# Patient Record
Sex: Male | Born: 1950 | Race: White | Hispanic: No | Marital: Single | Smoking: Never smoker
Health system: Southern US, Community
[De-identification: ages and names within clinical notes are randomized; demographics above are authoritative.]

## PROBLEM LIST (undated history)

## (undated) DIAGNOSIS — G809 Cerebral palsy, unspecified: Secondary | ICD-10-CM

## (undated) DIAGNOSIS — E785 Hyperlipidemia, unspecified: Secondary | ICD-10-CM

## (undated) DIAGNOSIS — F79 Unspecified intellectual disabilities: Secondary | ICD-10-CM

## (undated) DIAGNOSIS — E119 Type 2 diabetes mellitus without complications: Secondary | ICD-10-CM

---

## 2011-02-25 ENCOUNTER — Ambulatory Visit (HOSPITAL_COMMUNITY)
Admission: RE | Admit: 2011-02-25 | Discharge: 2011-02-25 | Disposition: A | Discharge status: Home / Self Care | Payer: Medicare Other | Source: Ambulatory Visit | Attending: Oral Surgery | Admitting: Oral Surgery

## 2011-02-25 ENCOUNTER — Other Ambulatory Visit (HOSPITAL_COMMUNITY): Payer: Self-pay | Admitting: Oral Surgery

## 2011-02-25 ENCOUNTER — Encounter (HOSPITAL_COMMUNITY)
Admission: RE | Admit: 2011-02-25 | Discharge: 2011-02-25 | Disposition: A | Discharge status: Home / Self Care | Payer: Medicare Other | Source: Ambulatory Visit | Attending: Oral Surgery | Admitting: Oral Surgery

## 2011-02-25 DIAGNOSIS — Z01811 Encounter for preprocedural respiratory examination: Secondary | ICD-10-CM | POA: Insufficient documentation

## 2011-02-25 DIAGNOSIS — K029 Dental caries, unspecified: Secondary | ICD-10-CM | POA: Insufficient documentation

## 2011-02-25 DIAGNOSIS — Z01812 Encounter for preprocedural laboratory examination: Secondary | ICD-10-CM | POA: Insufficient documentation

## 2011-02-25 DIAGNOSIS — Z01818 Encounter for other preprocedural examination: Secondary | ICD-10-CM | POA: Insufficient documentation

## 2011-02-25 LAB — BASIC METABOLIC PANEL
Chloride: 102 mEq/L (ref 96–112)
Creatinine, Ser: 0.54 mg/dL (ref 0.4–1.5)
GFR calc Af Amer: >60 mL/min (ref >60)
GFR calc non Af Amer: >60 mL/min (ref >60)
Potassium: 5 mEq/L (ref 3.5–5.1)

## 2011-02-25 LAB — CBC
Platelets: 297 10*3/uL (ref 150–400)
RBC: 4.49 MIL/uL (ref 4.22–5.81)
WBC: 8.2 10*3/uL (ref 4.0–10.5)

## 2011-03-03 ENCOUNTER — Ambulatory Visit (HOSPITAL_COMMUNITY)
Admission: RE | Admit: 2011-03-03 | Discharge: 2011-03-03 | Disposition: A | Discharge status: Home / Self Care | Payer: Medicare Other | Source: Ambulatory Visit | Attending: Oral Surgery | Admitting: Oral Surgery

## 2011-03-03 DIAGNOSIS — M278 Other specified diseases of jaws: Secondary | ICD-10-CM | POA: Insufficient documentation

## 2011-03-03 DIAGNOSIS — F79 Unspecified intellectual disabilities: Secondary | ICD-10-CM | POA: Insufficient documentation

## 2011-03-03 DIAGNOSIS — Z01818 Encounter for other preprocedural examination: Secondary | ICD-10-CM | POA: Insufficient documentation

## 2011-03-03 DIAGNOSIS — E119 Type 2 diabetes mellitus without complications: Secondary | ICD-10-CM | POA: Insufficient documentation

## 2011-03-03 DIAGNOSIS — Z01812 Encounter for preprocedural laboratory examination: Secondary | ICD-10-CM | POA: Insufficient documentation

## 2011-03-03 DIAGNOSIS — I10 Essential (primary) hypertension: Secondary | ICD-10-CM | POA: Insufficient documentation

## 2011-03-03 DIAGNOSIS — K089 Disorder of teeth and supporting structures, unspecified: Secondary | ICD-10-CM | POA: Insufficient documentation

## 2011-03-03 DIAGNOSIS — Z794 Long term (current) use of insulin: Secondary | ICD-10-CM | POA: Insufficient documentation

## 2011-03-03 DIAGNOSIS — E669 Obesity, unspecified: Secondary | ICD-10-CM | POA: Insufficient documentation

## 2011-03-03 DIAGNOSIS — Z01810 Encounter for preprocedural cardiovascular examination: Secondary | ICD-10-CM | POA: Insufficient documentation

## 2011-03-03 DIAGNOSIS — K029 Dental caries, unspecified: Secondary | ICD-10-CM | POA: Insufficient documentation

## 2011-03-03 LAB — GLUCOSE, CAPILLARY: Glucose-Capillary: 116 mg/dL — ABNORMAL HIGH (ref 70–99)

## 2011-03-12 NOTE — Op Note (Signed)
James Berger, James Berger                 ACCOUNT NO.:  [id number]  MEDICAL RECORD NO.:  [id number]           PATIENT TYPE:  O  LOCATION:  SDSC                         FACILITY:  [facility]  PHYSICIAN:  Georgia Lopes, M.D.  DATE OF BIRTH:  04-20-1951  DATE OF PROCEDURE:  03/03/2011 DATE OF DISCHARGE:  03/03/2011                              OPERATIVE REPORT   PREOPERATIVE DIAGNOSIS:  Non-restorable teeth # 4, 5,7, 8, 9, 10, 11, 20, 21, 22, 23, 24, 25, 26, and 27.  POSTOPERATIVE DIAGNOSES:  Non-restorable teeth # 4, 5,7, 8, 9, 10, 11, 20, 21, 22, 23, 24, 25, 26, and 27.  Lingual torus right mandible.  PROCEDURE:  Removal of teeth #4, 5, 7, 8, 9, 10, 11, 20, 21, 22, 23, 24, 25, 26, 27, alveoplasty of the right and left mandible, removal of right mandibular lingual tori.  SURGEON:  Georgia Lopes, MD  ANESTHESIA:  General, Dr. Gelene Mink.  ASSISTANTS:  Fraser Din and Cimler  INDICATIONS FOR PROCEDURE:  James Berger is a 60 year old male who was referredto my office by his general dentist for removal of all remaining teeth due to dental caries.  He has a history of mental retardation, he is an insulin-dependent diabetic with hypertension and is obese.  Because of the need for general anesthesia, the surgery was scheduled at [facility] for airway protection via intubation.  PROCEDURE:  The patient was taken to the operating room, placed on the table in supine position.  General anesthesia was administered intravenously and a nasal endotracheal tube was placed atraumatically. The tube was secured, the eyes protected, and the patient was draped for the procedure.  The posterior pharynx was suctioned.  A throat pack was placed, 2% lidocaine 1:100,000 epinephrine was infiltrated in inferior alveolar block on the right and left side and buccal and palatal infiltration of the maxilla, total of 18 mL was utilized.  Then, a #15 blade was used to make a full-thickness incision buccally and  lingually around teeth numbers 20, 21, 22, 23, 24, 25 and 26 in the mandible and around teeth numbers 7, 8, 9, 10, and 11 in the maxilla.  The periosteum was reflected buccally and lingually in the mandible, and buccally and palatally in the maxilla, and proximal bone was removed with the striker handpiece and in a 702 bur around the posterior teeth.  Then the teeth were elevated with 301 elevator and removed with the Asch forceps in the mandible and the upper #150 forceps in the maxilla, tooth #21 and 22 fractured additional bone was removed around these teeth with a drill and the teeth were removed with the 301 elevator and the rongeur.  The periosteum was further reflected and alveoplasty was performed with an egg-shaped bur and then a bone file.  The areas were then curetted and irrigated and closed with 3-0 chromic.  Bite block was repositioned. Attention was turned to right side of the mouth, #15 blade was used to make full-thickness incision overlying tooth #27 and carrying proximally to the area of the first molar.  In the maxilla, 15 blade was used to make a full-thickness  incision around teeth #4 and 5.  The periosteum was reflected buccally and palatally in the maxilla, and buccally and lingually in the mandible and then bone was removed with a 702 drill and the striker handpiece under irrigation.  The teeth were elevated with 301 elevator and removed from the mouth with the upper 150 forceps and the Asch forceps in the mandible.  The alveolar ridge was smoothed with an egg-shaped bur in the maxilla and mandible and then bone file was used to further smooth the area and the mandible.  On the right side, there was a lingual overhang of consistent with exostosis.  This was smoothed using the sylvan elevator for retraction of the lingual tissues and the egg-shaped bur.  Then areas were irrigated and closed with 3-0 chromic.  The oral cavity was inspected, found to have good contour  and closure.  The area was irrigated and suctioned.  Throat pack was removed.  The patient was awakened, taken to the recovery room breath spontaneously in good condition.  COMPLICATIONS:  None.  SPECIMENS:  None.     Georgia Lopes, M.D.     SMJ/MEDQ  D:  03/03/2011  T:  03/04/2011  Job:  [id number]  Electronically Signed by Ocie Doyne M.D. on 03/12/2011 10:06:09 AM

## 2013-06-27 ENCOUNTER — Other Ambulatory Visit: Payer: Self-pay | Admitting: Geriatric Medicine

## 2013-06-27 DIAGNOSIS — S065X9A Traumatic subdural hemorrhage with loss of consciousness of unspecified duration, initial encounter: Secondary | ICD-10-CM

## 2013-06-27 DIAGNOSIS — S065XAA Traumatic subdural hemorrhage with loss of consciousness status unknown, initial encounter: Secondary | ICD-10-CM

## 2013-06-29 ENCOUNTER — Ambulatory Visit
Admission: RE | Admit: 2013-06-29 | Discharge: 2013-06-29 | Disposition: A | Discharge status: Home / Self Care | Payer: Medicare Other | Source: Ambulatory Visit | Attending: Geriatric Medicine | Admitting: Geriatric Medicine

## 2013-06-29 DIAGNOSIS — S065X9A Traumatic subdural hemorrhage with loss of consciousness of unspecified duration, initial encounter: Secondary | ICD-10-CM

## 2013-08-10 ENCOUNTER — Emergency Department (HOSPITAL_COMMUNITY): Payer: Medicare Other

## 2013-08-10 ENCOUNTER — Encounter (HOSPITAL_COMMUNITY): Payer: Self-pay | Admitting: Emergency Medicine

## 2013-08-10 ENCOUNTER — Emergency Department (HOSPITAL_COMMUNITY)
Admission: EM | Admit: 2013-08-10 | Discharge: 2013-08-10 | Disposition: A | Discharge status: Home / Self Care | Payer: Medicare Other | Attending: Emergency Medicine | Admitting: Emergency Medicine

## 2013-08-10 DIAGNOSIS — S0181XA Laceration without foreign body of other part of head, initial encounter: Secondary | ICD-10-CM

## 2013-08-10 DIAGNOSIS — S0990XA Unspecified injury of head, initial encounter: Secondary | ICD-10-CM

## 2013-08-10 DIAGNOSIS — Z794 Long term (current) use of insulin: Secondary | ICD-10-CM | POA: Insufficient documentation

## 2013-08-10 DIAGNOSIS — S0180XA Unspecified open wound of other part of head, initial encounter: Secondary | ICD-10-CM | POA: Insufficient documentation

## 2013-08-10 DIAGNOSIS — Z8669 Personal history of other diseases of the nervous system and sense organs: Secondary | ICD-10-CM | POA: Insufficient documentation

## 2013-08-10 DIAGNOSIS — E119 Type 2 diabetes mellitus without complications: Secondary | ICD-10-CM | POA: Insufficient documentation

## 2013-08-10 DIAGNOSIS — Z79899 Other long term (current) drug therapy: Secondary | ICD-10-CM | POA: Insufficient documentation

## 2013-08-10 DIAGNOSIS — Y929 Unspecified place or not applicable: Secondary | ICD-10-CM | POA: Insufficient documentation

## 2013-08-10 DIAGNOSIS — Z8659 Personal history of other mental and behavioral disorders: Secondary | ICD-10-CM | POA: Insufficient documentation

## 2013-08-10 DIAGNOSIS — W1809XA Striking against other object with subsequent fall, initial encounter: Secondary | ICD-10-CM | POA: Insufficient documentation

## 2013-08-10 DIAGNOSIS — E785 Hyperlipidemia, unspecified: Secondary | ICD-10-CM | POA: Insufficient documentation

## 2013-08-10 DIAGNOSIS — Y9389 Activity, other specified: Secondary | ICD-10-CM | POA: Insufficient documentation

## 2013-08-10 HISTORY — DX: Type 2 diabetes mellitus without complications: E11.9

## 2013-08-10 HISTORY — DX: Unspecified intellectual disabilities: F79

## 2013-08-10 HISTORY — DX: Hyperlipidemia, unspecified: E78.5

## 2013-08-10 HISTORY — DX: Cerebral palsy, unspecified: G80.9

## 2013-08-10 MED ORDER — LIDOCAINE-EPINEPHRINE-TETRACAINE (LET) SOLUTION
3.0000 mL | Freq: Once | NASAL | Status: AC
  Administered 2013-08-10: 3 mL via TOPICAL
  Filled 2013-08-10: qty 3

## 2013-08-10 MED ORDER — ZIPRASIDONE MESYLATE 20 MG IM SOLR
INTRAMUSCULAR | Status: AC
  Filled 2013-08-10: qty 20

## 2013-08-10 MED ORDER — LORAZEPAM 2 MG/ML IJ SOLN
1.0000 mg | Freq: Once | INTRAMUSCULAR | Status: DC
  Filled 2013-08-10: qty 1

## 2013-08-10 MED ORDER — LORAZEPAM 1 MG PO TABS
1.0000 mg | ORAL_TABLET | Freq: Once | ORAL | Status: AC
  Administered 2013-08-10: 1 mg via ORAL
  Filled 2013-08-10: qty 1

## 2013-08-10 MED ORDER — ZIPRASIDONE MESYLATE 20 MG IM SOLR
15.0000 mg | Freq: Once | INTRAMUSCULAR | Status: AC
  Administered 2013-08-10: 15 mg via INTRAMUSCULAR

## 2013-08-10 MED ORDER — LORAZEPAM 2 MG/ML IJ SOLN
2.0000 mg | Freq: Once | INTRAMUSCULAR | Status: AC
  Administered 2013-08-10: 2 mg via INTRAVENOUS

## 2013-08-10 NOTE — ED Provider Notes (Signed)
CSN: [id number]     Arrival date & time 08/10/13  1900 History   First MD Initiated Contact with Patient 08/10/13 1909     Chief Complaint  Patient presents with  . Fall  . Head Injury   (Consider location/radiation/quality/duration/timing/severity/associated sxs/prior Treatment) HPI  61yM presenting after striking head during fall. Lost balance and struck forehead. Laceration to L eyebrow. Was wearing helmet at time of fall. Hx of multiple falls and SDH. Per caretaker at bedside, pt seems to be at his baseline. No blood thinners. No vomiting.    Past Medical History  Diagnosis Date  . Cerebral palsy   . Mental retardation   . Diabetes mellitus without complication   . Hyperlipemia    History reviewed. No pertinent past surgical history. History reviewed. No pertinent family history. History  Substance Use Topics  . Smoking status: Never Smoker   . Smokeless tobacco: Not on file  . Alcohol Use: No    Review of Systems  All systems reviewed and negative, other than as noted in HPI.   Allergies  Review of patient's allergies indicates no known allergies.  Home Medications   Current Outpatient Rx  Name  Route  Sig  Dispense  Refill  . acetaminophen (TYLENOL) 325 MG tablet   Oral   Take 650 mg by mouth at bedtime.         Marland Kitchen amLODipine (NORVASC) 10 MG tablet   Oral   Take 10 mg by mouth daily.         Marland Kitchen atorvastatin (LIPITOR) 10 MG tablet   Oral   Take 10 mg by mouth at bedtime.         . docusate sodium (COLACE) 100 MG capsule   Oral   Take 100 mg by mouth 2 (two) times daily.         . feeding supplement (GLUCERNA SHAKE) LIQD   Oral   Take 237 mLs by mouth 2 (two) times daily between meals.         . haloperidol (HALDOL) 2 MG tablet   Intramuscular   Inject 2 mg into the muscle every 8 (eight) hours as needed (extreme agitation).         . insulin aspart (NOVOLOG FLEXPEN) 100 UNIT/ML SOPN FlexPen   Subcutaneous   Inject 2-6 Units into the  skin 3 (three) times daily with meals. Sliding scale: 190-230=2 units, 231-270=3 units, 271-310=4 units, 311-350=5 units, 351-400=6 units         . insulin detemir (LEVEMIR) 100 UNIT/ML injection   Subcutaneous   Inject 36 Units into the skin daily.         . magnesium hydroxide (MILK OF MAGNESIA) 400 MG/5ML suspension   Oral   Take 30 mLs by mouth at bedtime.         Marland Kitchen PARoxetine (PAXIL) 30 MG tablet   Oral   Take 30 mg by mouth every morning.         Marland Kitchen QUEtiapine (SEROQUEL) 25 MG tablet   Oral   Take 25 mg by mouth 2 (two) times daily.         . tamsulosin (FLOMAX) 0.4 MG CAPS capsule   Oral   Take 0.4 mg by mouth.         . traZODone (DESYREL) 50 MG tablet   Oral   Take 50 mg by mouth at bedtime.         . valsartan (DIOVAN) 160 MG tablet   Oral  Take 320 mg by mouth daily.          BP 140/69  Pulse 77  Temp(Src) 98.2 F (36.8 C) (Oral)  Resp 18  SpO2 98% Physical Exam  Nursing note and vitals reviewed. Constitutional: He appears well-developed and well-nourished. No distress.  HENT:  Head: Normocephalic.  Stellate laceration L eyebrow, ~4cm in total length. Superficial abrasions to occipital region, chronic from helmet per caregiver  Eyes: Conjunctivae are normal. Right eye exhibits no discharge. Left eye exhibits no discharge.  Neck: Neck supple.  Cardiovascular: Normal rate, regular rhythm and normal heart sounds.  Exam reveals no gallop and no friction rub.   No murmur heard. Pulmonary/Chest: Effort normal and breath sounds normal. No respiratory distress.  Abdominal: Soft. He exhibits no distension. There is no tenderness.  Musculoskeletal: He exhibits no edema and no tenderness.  No midline spinal tenderness  Neurological: He is alert.  Skin: Skin is warm and dry.  Psychiatric:  Difficult to understand. Combative. Moving all extremities with good strength.     ED Course  Procedures (including critical care time)  LACERATION  REPAIR Performed by: Raeford Razor Authorized by: Raeford Razor Consent: Verbal consent obtained. Risks and benefits: risks, benefits and alternatives were discussed Consent given by: patient Patient identity confirmed: provided demographic data Prepped and Draped in normal sterile fashion Wound explored  Laceration Location: L eyebrow  Laceration Length: 3.5 cm  No Foreign Bodies seen or palpated  Anesthesia: local infiltration  Local anesthetic: lidocaine 2% w epinephrine  Anesthetic total: 4 ml  Irrigation method: syringe  Amount of cleaning: standard  Skin closure: layered, 5-0 vicryl rapide  Number of sutures: 9  Technique: simple interrupted and single deep suture to close dead space  Patient tolerance: Patient tolerated the procedure well with no immediate complications.  Labs Review Labs Reviewed - No data to display Imaging Review Ct Head Wo Contrast  08/10/2013   *RADIOLOGY REPORT*  Clinical Data:  Recent traumatic injury with pain  CT HEAD WITHOUT CONTRAST CT CERVICAL SPINE WITHOUT CONTRAST  Technique:  Multidetector CT imaging of the head and cervical spine was performed following the standard protocol without intravenous contrast.  Multiplanar CT image reconstructions of the cervical spine were also generated.  Comparison:  06/29/2013  CT HEAD  Findings: The bony calvarium is intact.  Soft tissue swelling is noted over the left orbit consistent with a recent injury. Atrophic changes are again identified. Prominent cisterna magna is noted.  The previously seen subdural hematoma is no longer identified.  No acute hemorrhage is seen.  No acute infarct is noted.  IMPRESSION: Chronic changes without acute intracranial abnormality. Soft tissue changes of the left orbit are seen consistent with a recent history.  CT CERVICAL SPINE  Findings: Seven cervical segments are well visualized.  Vertebral body height is well-maintained.  There is loss of the normal cervical  lordosis likely related to muscular spasm.  Multilevel osteophytic changes are seen with varying degrees of neural foraminal narrowing bilaterally.  No acute fracture or acute facet abnormality is seen.  The surrounding soft tissue structures show no acute abnormality.  A cystic lesion is noted in the right lobe of the thyroid.  IMPRESSION: Multilevel degenerative change without acute bony abnormality. Changes consistent with muscular spasm are noted.  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   Original Report Authenticated By: Alcide Clever, M.D.   Ct Cervical Spine Wo Contrast  08/10/2013   *RADIOLOGY REPORT*  Clinical Data:  Recent traumatic injury with  pain  CT HEAD WITHOUT CONTRAST CT CERVICAL SPINE WITHOUT CONTRAST  Technique:  Multidetector CT imaging of the head and cervical spine was performed following the standard protocol without intravenous contrast.  Multiplanar CT image reconstructions of the cervical spine were also generated.  Comparison:  06/29/2013  CT HEAD  Findings: The bony calvarium is intact.  Soft tissue swelling is noted over the left orbit consistent with a recent injury. Atrophic changes are again identified. Prominent cisterna magna is noted.  The previously seen subdural hematoma is no longer identified.  No acute hemorrhage is seen.  No acute infarct is noted.  IMPRESSION: Chronic changes without acute intracranial abnormality. Soft tissue changes of the left orbit are seen consistent with a recent history.  CT CERVICAL SPINE  Findings: Seven cervical segments are well visualized.  Vertebral body height is well-maintained.  There is loss of the normal cervical lordosis likely related to muscular spasm.  Multilevel osteophytic changes are seen with varying degrees of neural foraminal narrowing bilaterally.  No acute fracture or acute facet abnormality is seen.  The surrounding soft tissue structures show no acute abnormality.  A cystic lesion is noted in the right lobe of the  thyroid.  IMPRESSION: Multilevel degenerative change without acute bony abnormality. Changes consistent with muscular spasm are noted.  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   Original Report Authenticated By: Alcide Clever, M.D.    MDM   1. Facial laceration, initial encounter   2. Head injury, acute, initial encounter     9:45 PM Imaging as above. Attempted to suture but pt becoming very agitated, swinging arms and trying get up from bed. Too agitated to tolerate wound closure despite applying LET and giving an oral dose of ativan. For pt/my safety, will give additional meds.   Raeford Razor, MD 08/16/13 [phone]

## 2013-08-10 NOTE — ED Notes (Signed)
Per EMS: Pt from [facility].  Hx of MR/CP, diabetes.  States that there was an unwitnessed fall from a wheelchair.  Hit his head on the corner of a wall.  Lac noted to left eyebrow.  Bleeding controlled.  Pt normally wears a helmet and was wearing the helmet at the time of the fall.  Facility sent a tech with pt because pt will not allow strangers to care for him and becomes aggressive.

## 2014-01-10 ENCOUNTER — Telehealth: Payer: Self-pay | Admitting: *Deleted

## 2014-01-10 NOTE — Telephone Encounter (Signed)
Error - wrong pt

## 2014-04-11 IMAGING — CT CT HEAD W/O CM
3 of 4 series · 15 of 30 positions shown, 18 images · non-contrast
Comparison: 06/29/2013

CT HEAD

***ADDENDUM*** CREATED: 08/12/2013 [DATE]

I am now comparing the studies of [DATE], [DATE], [DATE], and
08/10/2013 to one another.
On 06/02/2013 the patient had a subdural hematoma along the
tentorium and along the posterior aspect of the interhemispheric
falx as well as over the left occipital lobe with a tiny amount of
subdural hemorrhage over the left parietal and frontal regions.
On 06/07/2013 the tiny areas of hemorrhage over the left frontal
and parietal regions had resolved.  The left occipital, falcine,
and tentorial subdural hemorrhages were not significantly changed.
On 06/29/2013, after the patient fell again, there is no new
hemorrhage.  The scan demonstrates aging of the left occipital
subdural hematoma and decreased blood along the tentorium and
interhemispheric falx.
On  08/10/2013 in the left occipital subdural hematoma is either
isointense or has resolved.  The tentorium and falcine components
of the hemorrhage are further diminished.  No new hemorrhages.
CLINICAL DATA: Recent traumatic injury with pain
CT HEAD WITHOUT CONTRAST
CT CERVICAL SPINE WITHOUT CONTRAST
TECHNIQUE: Multidetector CT imaging of the head and cervical spine
was performed following the standard protocol without intravenous
contrast.  Multiplanar CT image reconstructions of the cervical
spine were also generated.

[Series 3: bone windows · axial · 0.48mm/px · z∈[+1165,+1237]mm · 3 of 49 slices shown]
[im 13/49  bone]
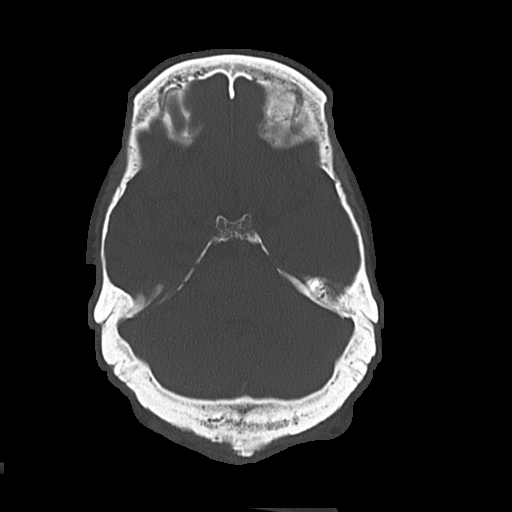
[im 25/49  bone]
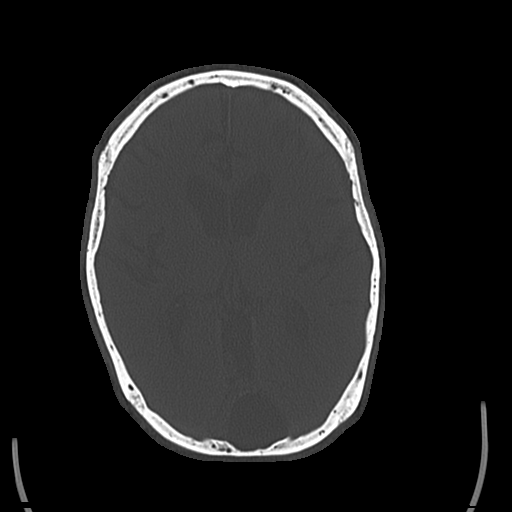
[im 37/49  bone]
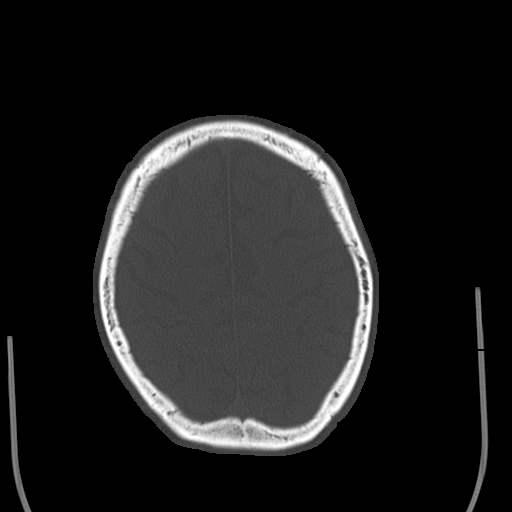

[Series 4: c-spine st · axial · 0.34mm/px · z∈[+960,+998]mm · 3 of 97 slices shown]
[im 10/97  brain]
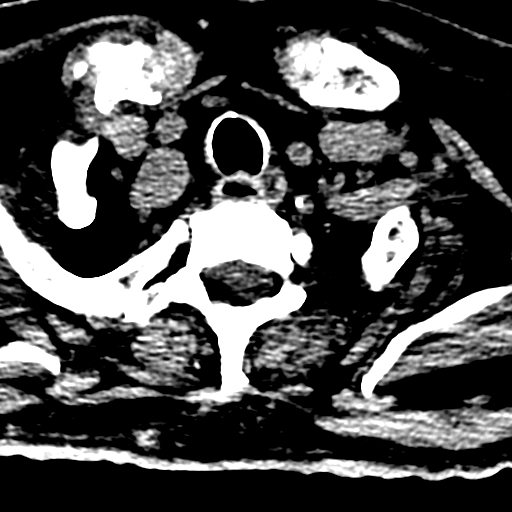
[im 20/97  brain]
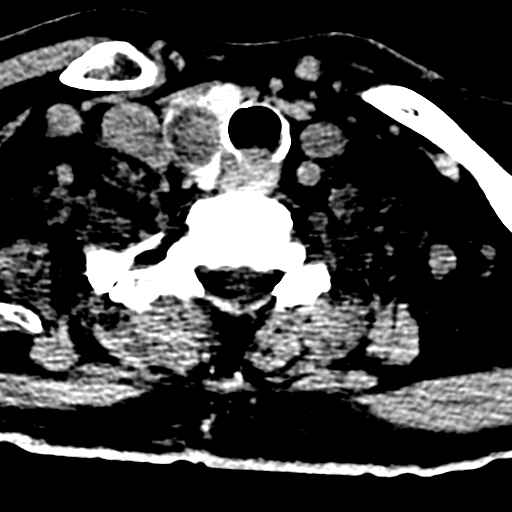
[im 29/97  brain]
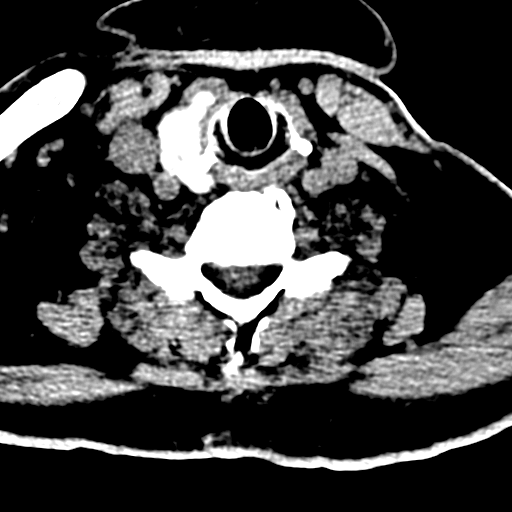

[Series 9: axial recon · axial · 0.23mm/px · z∈[+952,+1103]mm · 9 of 96 slices shown, 12 images]
[im 10/96  brain]
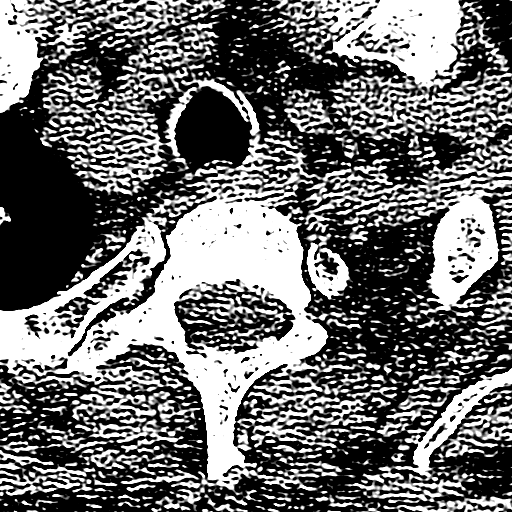
[im 10/96  bone]
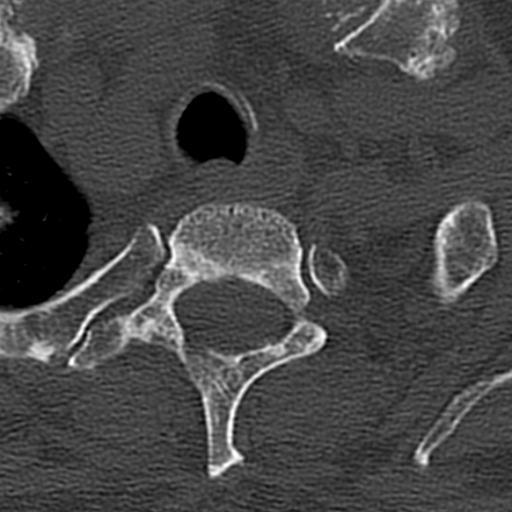
[im 20/96  brain]
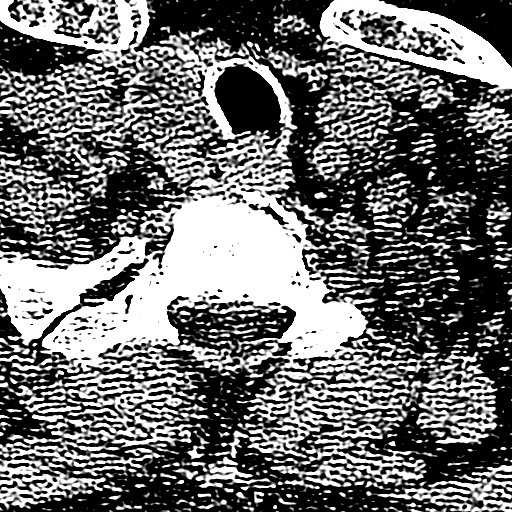
[im 29/96  brain]
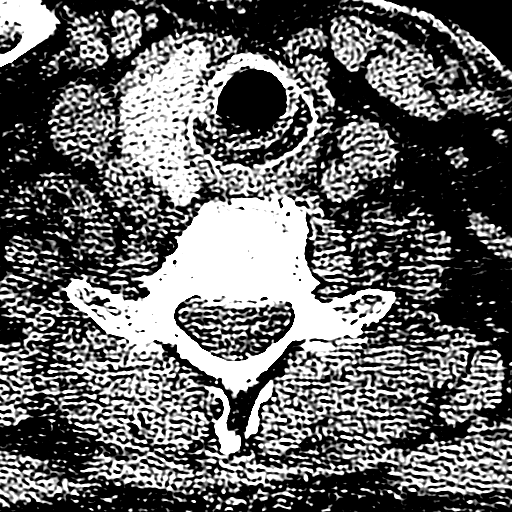
[im 39/96  brain]
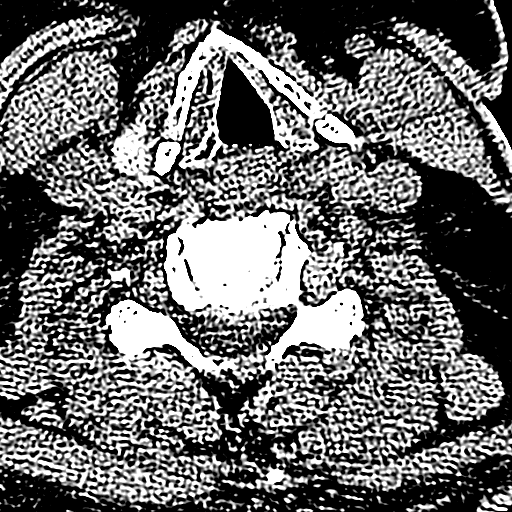
[im 48/96  brain]
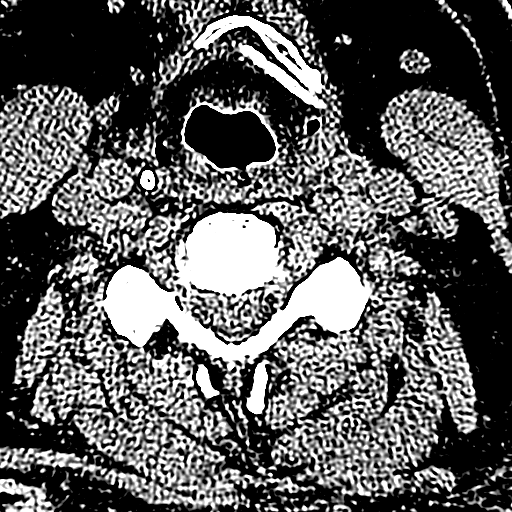
[im 48/96  bone]
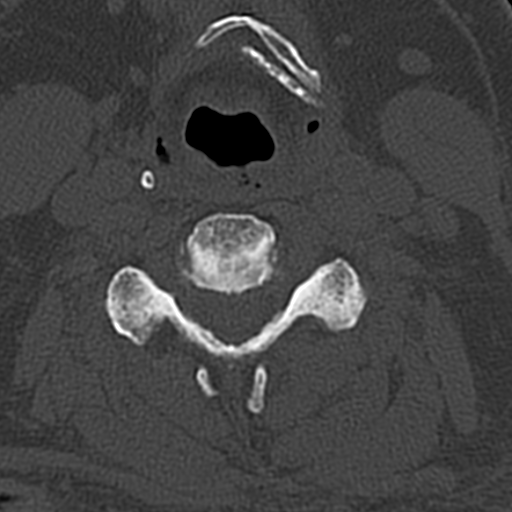
[im 58/96  brain]
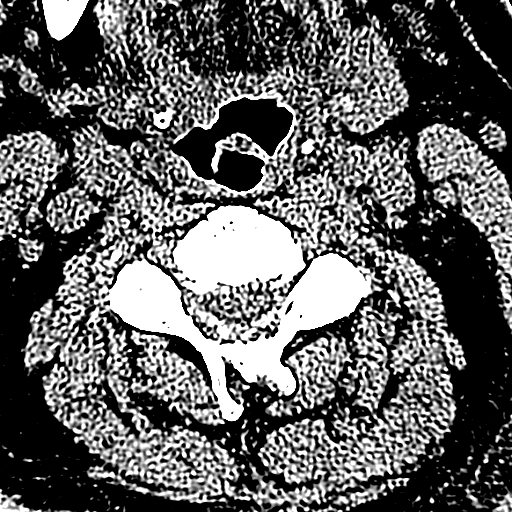
[im 67/96  brain]
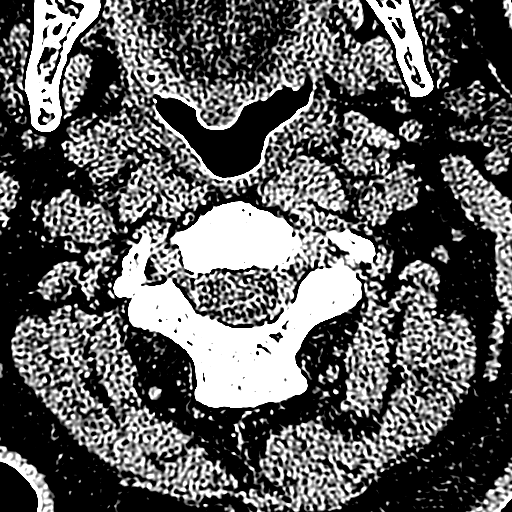
[im 77/96  brain]
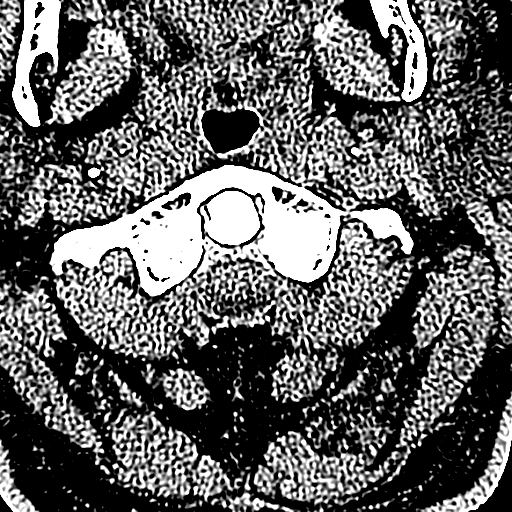
[im 86/96  brain]
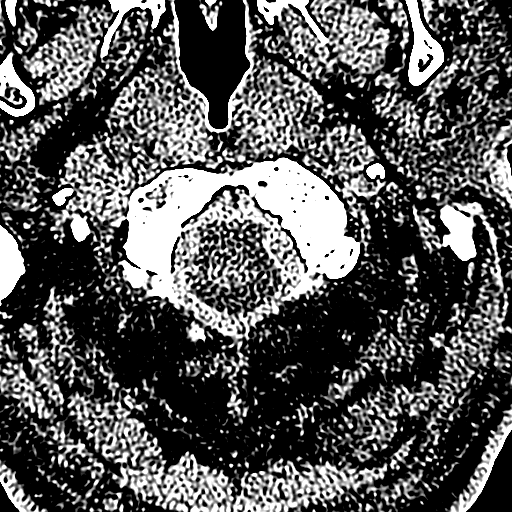
[im 86/96  bone]
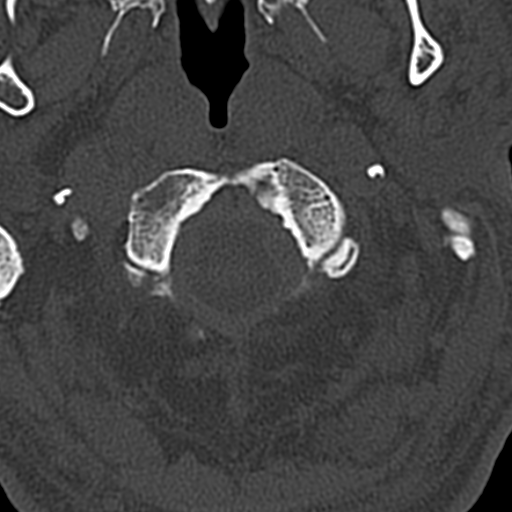

[15 of 30 positions shown; findings below may reference images not displayed]

FINDINGS: The bony calvarium is intact.  Soft tissue swelling is
noted over the left orbit consistent with a recent injury.
Atrophic changes are again identified. Prominent cisterna magna is
noted.  The previously seen subdural hematoma is no longer
identified.  No acute hemorrhage is seen.  No acute infarct is
noted..
IMPRESSION: Chronic changes without acute intracranial abnormality. Soft tissue
changes of the left orbit are seen consistent with a recent
history.

CT CERVICAL SPINE
FINDINGS: Seven cervical segments are well visualized.  Vertebral
body height is well-maintained.  There is loss of the normal
cervical lordosis likely related to muscular spasm.  Multilevel
osteophytic changes are seen with varying degrees of neural
foraminal narrowing bilaterally.  No acute fracture or acute facet
abnormality is seen.  The surrounding soft tissue structures show
no acute abnormality.  A cystic lesion is noted in the right lobe
of the thyroid.
IMPRESSION: Multilevel degenerative change without acute bony abnormality.
Changes consistent with muscular spasm are noted.

Likely chronic right thyroid cystic lesion.

## 2018-01-08 DEATH — deceased
